# Patient Record
Sex: Female | Born: 1965 | Race: White | Hispanic: No | Marital: Married
Health system: Southern US, Community
[De-identification: ages and names within clinical notes are randomized; demographics above are authoritative.]

---

## 2007-08-01 ENCOUNTER — Encounter: Admission: RE | Admit: 2007-08-01 | Discharge: 2007-08-01 | Payer: Self-pay | Admitting: Obstetrics and Gynecology

## 2010-01-13 ENCOUNTER — Encounter: Admission: RE | Admit: 2010-01-13 | Discharge: 2010-01-13 | Payer: Self-pay | Admitting: Obstetrics and Gynecology

## 2012-03-08 ENCOUNTER — Other Ambulatory Visit: Payer: Self-pay | Admitting: Obstetrics and Gynecology

## 2012-03-08 DIAGNOSIS — Z1231 Encounter for screening mammogram for malignant neoplasm of breast: Secondary | ICD-10-CM

## 2012-04-12 ENCOUNTER — Ambulatory Visit
Admission: RE | Admit: 2012-04-12 | Discharge: 2012-04-12 | Disposition: A | Discharge status: Home / Self Care | Payer: BC Managed Care – PPO | Source: Ambulatory Visit | Attending: Obstetrics and Gynecology | Admitting: Obstetrics and Gynecology

## 2012-04-12 DIAGNOSIS — Z1231 Encounter for screening mammogram for malignant neoplasm of breast: Secondary | ICD-10-CM

## 2013-02-19 ENCOUNTER — Other Ambulatory Visit: Payer: Self-pay | Admitting: Obstetrics and Gynecology

## 2013-02-19 DIAGNOSIS — Z1231 Encounter for screening mammogram for malignant neoplasm of breast: Secondary | ICD-10-CM

## 2013-07-05 ENCOUNTER — Ambulatory Visit: Payer: BC Managed Care – PPO

## 2013-07-19 ENCOUNTER — Ambulatory Visit
Admission: RE | Admit: 2013-07-19 | Discharge: 2013-07-19 | Disposition: A | Discharge status: Home / Self Care | Payer: BC Managed Care – PPO | Source: Ambulatory Visit | Attending: Obstetrics and Gynecology | Admitting: Obstetrics and Gynecology

## 2013-07-19 DIAGNOSIS — Z1231 Encounter for screening mammogram for malignant neoplasm of breast: Secondary | ICD-10-CM

## 2014-02-28 ENCOUNTER — Other Ambulatory Visit: Payer: Self-pay | Admitting: Obstetrics and Gynecology

## 2015-01-08 ENCOUNTER — Other Ambulatory Visit: Payer: Self-pay

## 2015-01-08 DIAGNOSIS — Z1231 Encounter for screening mammogram for malignant neoplasm of breast: Secondary | ICD-10-CM

## 2015-01-16 ENCOUNTER — Ambulatory Visit
Admission: RE | Admit: 2015-01-16 | Discharge: 2015-01-16 | Disposition: A | Discharge status: Home / Self Care | Payer: BLUE CROSS/BLUE SHIELD | Source: Ambulatory Visit

## 2015-01-16 DIAGNOSIS — Z1231 Encounter for screening mammogram for malignant neoplasm of breast: Secondary | ICD-10-CM

## 2015-04-15 ENCOUNTER — Other Ambulatory Visit: Payer: Self-pay | Admitting: Obstetrics and Gynecology

## 2016-04-19 ENCOUNTER — Other Ambulatory Visit: Payer: Self-pay | Admitting: Obstetrics and Gynecology

## 2016-04-19 DIAGNOSIS — Z1231 Encounter for screening mammogram for malignant neoplasm of breast: Secondary | ICD-10-CM

## 2016-05-25 ENCOUNTER — Ambulatory Visit
Admission: RE | Admit: 2016-05-25 | Discharge: 2016-05-25 | Disposition: A | Discharge status: Home / Self Care | Payer: BLUE CROSS/BLUE SHIELD | Source: Ambulatory Visit | Attending: Obstetrics and Gynecology | Admitting: Obstetrics and Gynecology

## 2016-05-25 DIAGNOSIS — Z1231 Encounter for screening mammogram for malignant neoplasm of breast: Secondary | ICD-10-CM

## 2017-05-13 ENCOUNTER — Other Ambulatory Visit: Payer: Self-pay | Admitting: Obstetrics and Gynecology

## 2017-05-13 DIAGNOSIS — Z1231 Encounter for screening mammogram for malignant neoplasm of breast: Secondary | ICD-10-CM

## 2017-06-29 ENCOUNTER — Ambulatory Visit
Admission: RE | Admit: 2017-06-29 | Discharge: 2017-06-29 | Disposition: A | Discharge status: Home / Self Care | Payer: BLUE CROSS/BLUE SHIELD | Source: Ambulatory Visit | Attending: Obstetrics and Gynecology | Admitting: Obstetrics and Gynecology

## 2017-06-29 DIAGNOSIS — Z1231 Encounter for screening mammogram for malignant neoplasm of breast: Secondary | ICD-10-CM

## 2018-05-19 ENCOUNTER — Other Ambulatory Visit: Payer: Self-pay | Admitting: Obstetrics and Gynecology

## 2018-05-19 DIAGNOSIS — Z1231 Encounter for screening mammogram for malignant neoplasm of breast: Secondary | ICD-10-CM

## 2018-08-01 ENCOUNTER — Ambulatory Visit: Payer: BLUE CROSS/BLUE SHIELD

## 2018-09-05 ENCOUNTER — Ambulatory Visit: Payer: BLUE CROSS/BLUE SHIELD

## 2018-10-23 ENCOUNTER — Ambulatory Visit
Admission: RE | Admit: 2018-10-23 | Discharge: 2018-10-23 | Disposition: A | Discharge status: Home / Self Care | Payer: BC Managed Care – PPO | Source: Ambulatory Visit | Attending: Obstetrics and Gynecology | Admitting: Obstetrics and Gynecology

## 2018-10-23 ENCOUNTER — Other Ambulatory Visit: Payer: Self-pay

## 2018-10-23 DIAGNOSIS — Z1231 Encounter for screening mammogram for malignant neoplasm of breast: Secondary | ICD-10-CM

## 2019-11-30 ENCOUNTER — Other Ambulatory Visit: Payer: Self-pay | Admitting: Obstetrics and Gynecology

## 2019-11-30 DIAGNOSIS — Z1231 Encounter for screening mammogram for malignant neoplasm of breast: Secondary | ICD-10-CM

## 2019-12-18 ENCOUNTER — Other Ambulatory Visit: Payer: Self-pay

## 2019-12-18 ENCOUNTER — Ambulatory Visit
Admission: RE | Admit: 2019-12-18 | Discharge: 2019-12-18 | Disposition: A | Discharge status: Home / Self Care | Payer: BC Managed Care – PPO | Source: Ambulatory Visit | Attending: Obstetrics and Gynecology | Admitting: Obstetrics and Gynecology

## 2019-12-18 DIAGNOSIS — Z1231 Encounter for screening mammogram for malignant neoplasm of breast: Secondary | ICD-10-CM

## 2020-03-22 ENCOUNTER — Ambulatory Visit: Payer: BC Managed Care – PPO | Attending: Internal Medicine

## 2020-03-22 DIAGNOSIS — Z23 Encounter for immunization: Secondary | ICD-10-CM

## 2020-03-22 NOTE — Progress Notes (Signed)
   [facility] Vaccination [facility]  Name:  Gabriela Holland    MRN: [id number] DOB: [birth year]  03/22/2020  [name] was observed post Covid-19 immunization for 15 minutes without incident. She was provided with Vaccine Information Sheet and instruction to access the V-Safe system.   [name] was instructed to call 911 with any severe reactions post vaccine: Marland Kitchen Difficulty breathing  . Swelling of face and throat  . A fast heartbeat  . A bad rash all over body  . Dizziness and weakness

## 2020-10-30 ENCOUNTER — Other Ambulatory Visit: Payer: Self-pay | Admitting: Obstetrics and Gynecology

## 2020-10-30 DIAGNOSIS — Z1231 Encounter for screening mammogram for malignant neoplasm of breast: Secondary | ICD-10-CM

## 2021-02-09 ENCOUNTER — Ambulatory Visit
Admission: RE | Admit: 2021-02-09 | Discharge: 2021-02-09 | Disposition: A | Discharge status: Home / Self Care | Payer: BC Managed Care – PPO | Source: Ambulatory Visit | Attending: Obstetrics and Gynecology | Admitting: Obstetrics and Gynecology

## 2021-02-09 ENCOUNTER — Other Ambulatory Visit: Payer: Self-pay

## 2021-02-09 DIAGNOSIS — Z1231 Encounter for screening mammogram for malignant neoplasm of breast: Secondary | ICD-10-CM

## 2022-03-11 ENCOUNTER — Other Ambulatory Visit: Payer: Self-pay | Admitting: Obstetrics and Gynecology

## 2022-03-11 DIAGNOSIS — Z1231 Encounter for screening mammogram for malignant neoplasm of breast: Secondary | ICD-10-CM

## 2022-03-16 ENCOUNTER — Ambulatory Visit
Admission: RE | Admit: 2022-03-16 | Discharge: 2022-03-16 | Disposition: A | Discharge status: Home / Self Care | Payer: BC Managed Care – PPO | Source: Ambulatory Visit | Attending: Obstetrics and Gynecology | Admitting: Obstetrics and Gynecology

## 2022-03-16 DIAGNOSIS — Z1231 Encounter for screening mammogram for malignant neoplasm of breast: Secondary | ICD-10-CM

## 2022-12-09 LAB — EXTERNAL GENERIC LAB PROCEDURE: COLOGUARD: NEGATIVE

## 2023-04-16 IMAGING — MG MM DIGITAL SCREENING BILAT W/ TOMO AND CAD
8 series · 9 of 24 positions shown · non-contrast
Comparison: Previous exam(s).

CLINICAL DATA: Screening.

EXAM:
DIGITAL SCREENING BILATERAL MAMMOGRAM WITH TOMOSYNTHESIS AND CAD
TECHNIQUE: Bilateral screening digital craniocaudal and mediolateral oblique
mammograms were obtained. Bilateral screening digital breast
tomosynthesis was performed. The images were evaluated with
computer-aided detection.

[L MLO synth-2D]
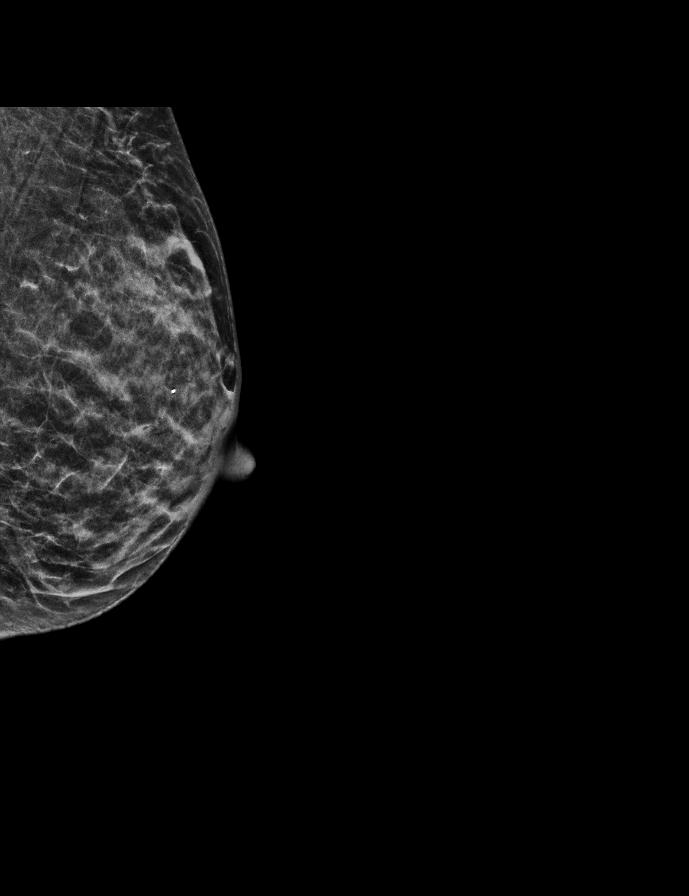

[R MLO synth-2D]
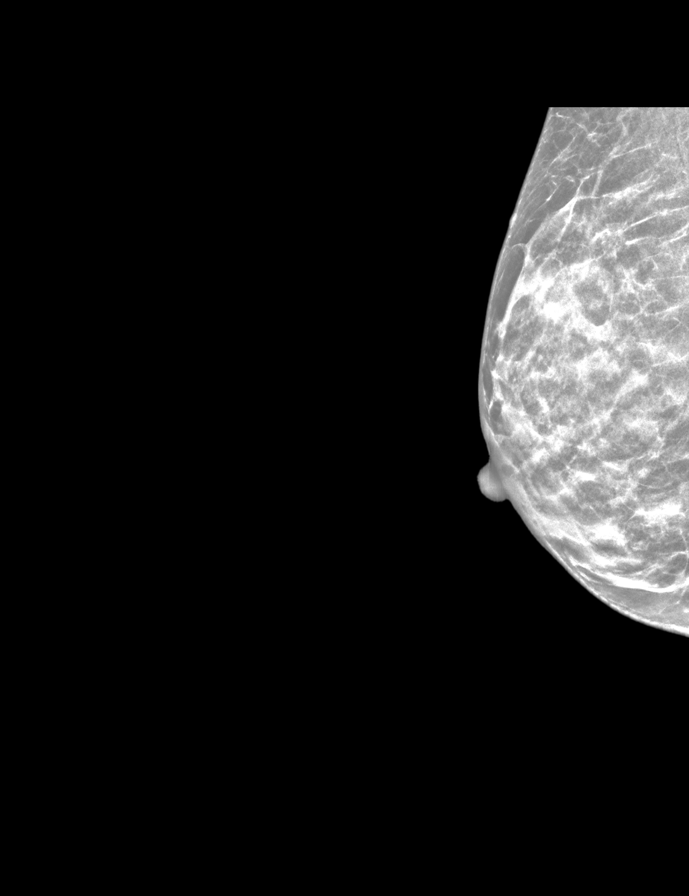

[R CC synth-2D]
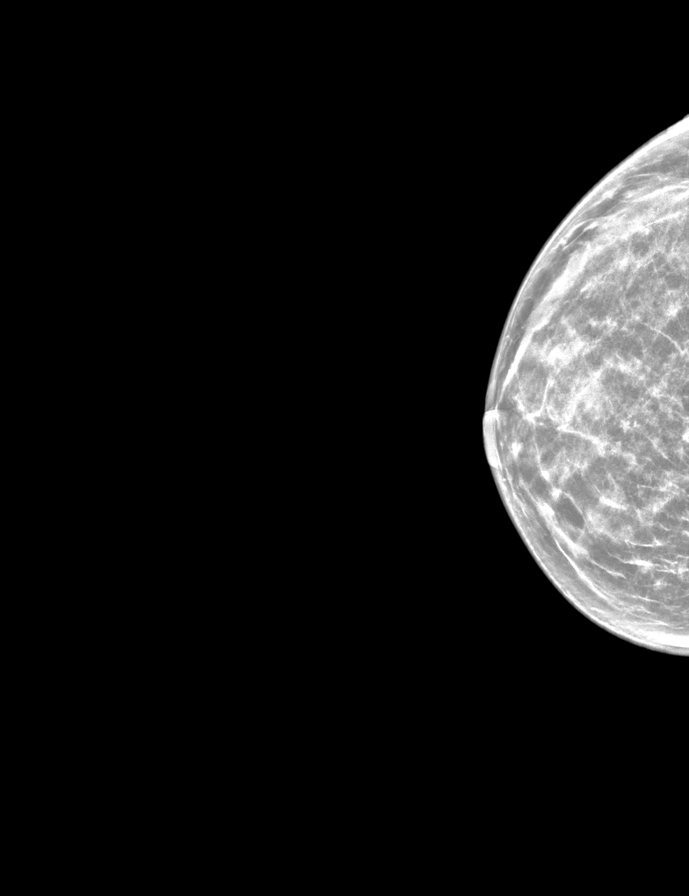

[L CC synth-2D]
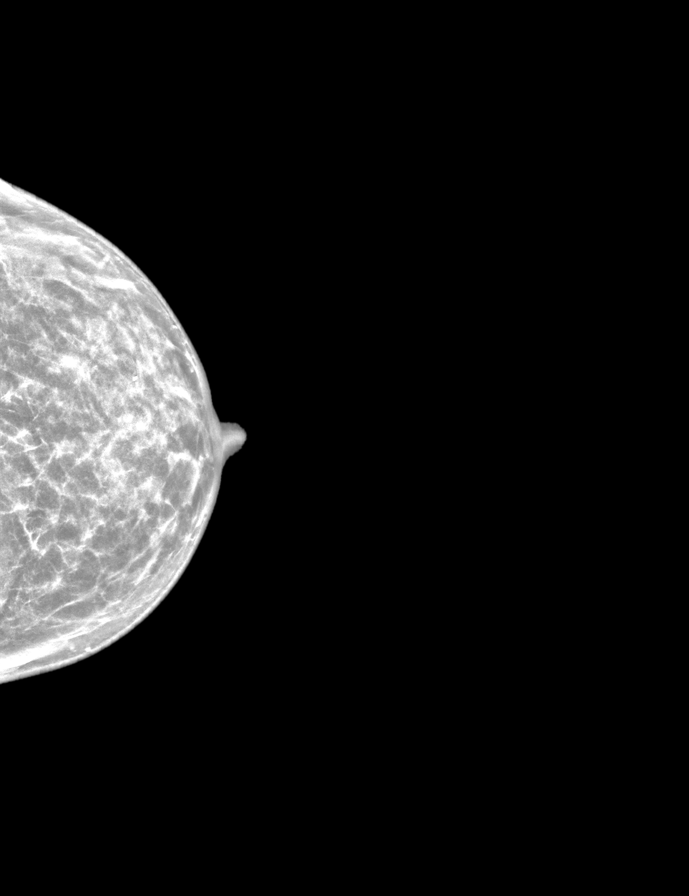

[R MLO tomo · 2 of 40 frames shown]
[frame 13/40]
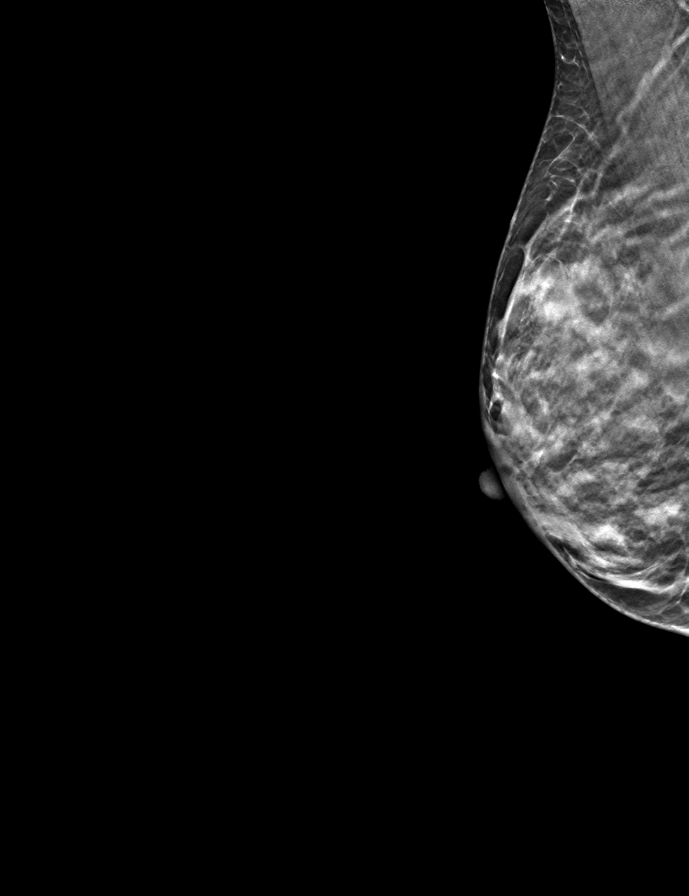
[frame 21/40]
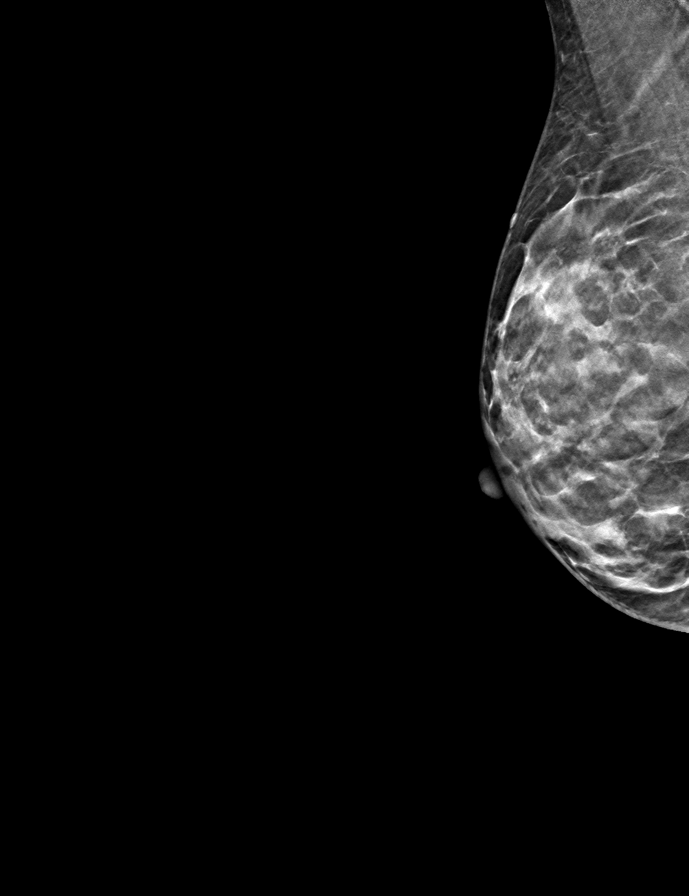

[L CC tomo · tomo slice 24/47.0]
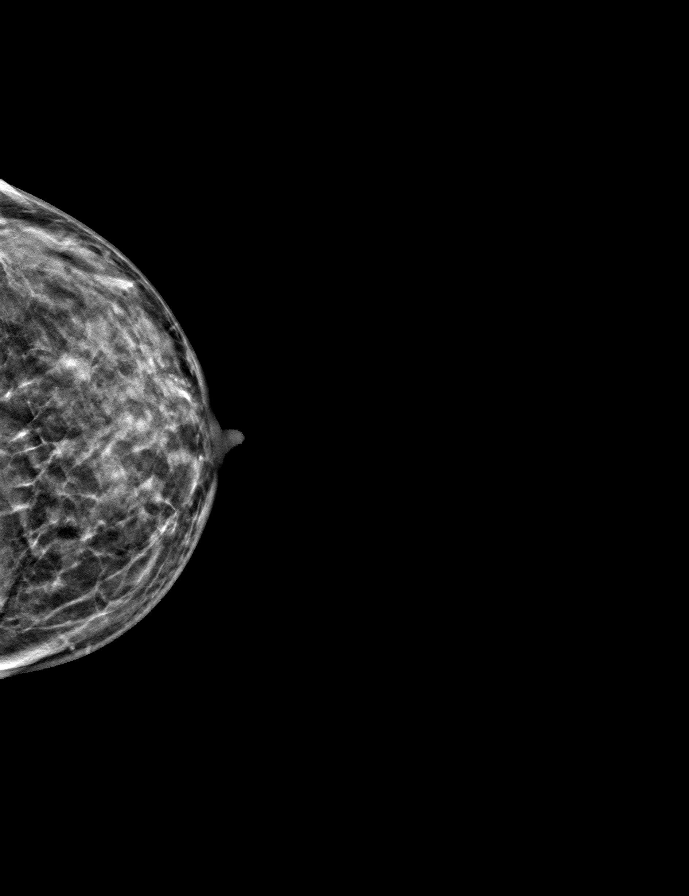

[L MLO tomo · tomo slice 20/39.0]
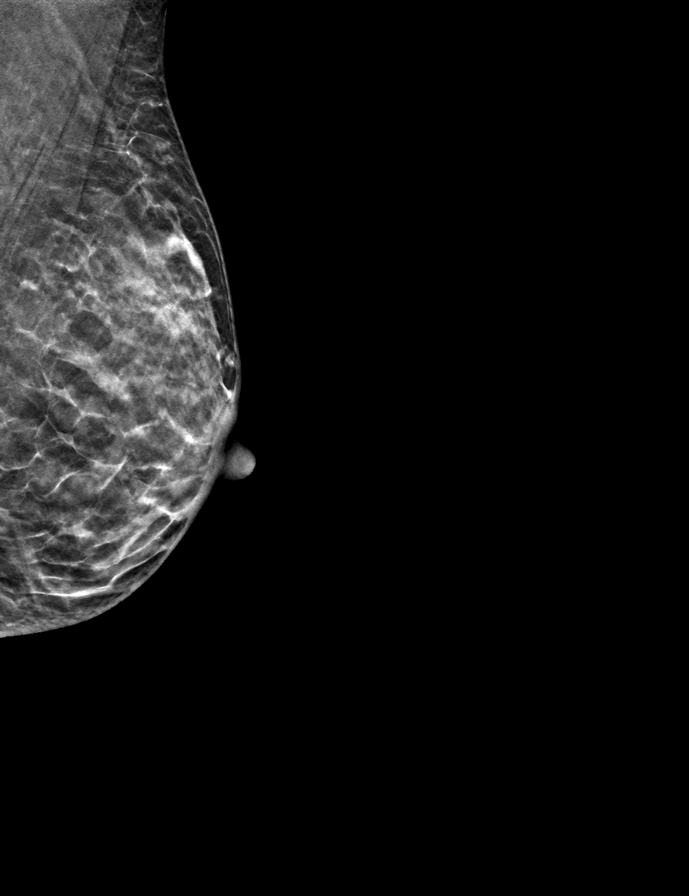

[R CC tomo · tomo slice 21/42.0]
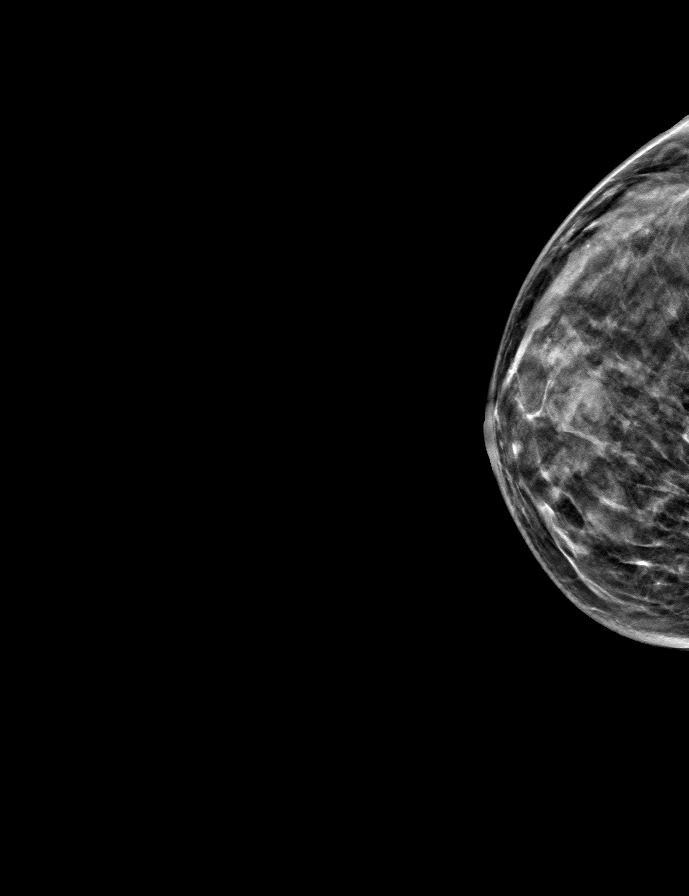

[9 of 24 positions shown; findings below may reference images not displayed]

ACR Breast Density Category c: The breast tissue is heterogeneously
dense, which may obscure small masses.
FINDINGS: There are no findings suspicious for malignancy.
IMPRESSION: No mammographic evidence of malignancy. A result letter of this
screening mammogram will be mailed directly to the patient.

RECOMMENDATION:
Screening mammogram in one year. (Code:Q3-W-BC3)

BI-RADS CATEGORY  1: Negative.

## 2023-05-18 ENCOUNTER — Other Ambulatory Visit: Payer: Self-pay | Admitting: Obstetrics and Gynecology

## 2023-05-18 DIAGNOSIS — Z1231 Encounter for screening mammogram for malignant neoplasm of breast: Secondary | ICD-10-CM

## 2023-06-07 ENCOUNTER — Ambulatory Visit: Payer: BC Managed Care – PPO

## 2023-06-21 ENCOUNTER — Ambulatory Visit
Admission: RE | Admit: 2023-06-21 | Discharge: 2023-06-21 | Disposition: A | Discharge status: Home / Self Care | Payer: BC Managed Care – PPO | Source: Ambulatory Visit | Attending: Obstetrics and Gynecology | Admitting: Obstetrics and Gynecology

## 2023-06-21 DIAGNOSIS — Z1231 Encounter for screening mammogram for malignant neoplasm of breast: Secondary | ICD-10-CM
